# Patient Record
Sex: Female | Born: 1965 | Race: Black or African American | Hispanic: No | Marital: Single
Health system: Southern US, Community
[De-identification: ages and names within clinical notes are randomized; demographics above are authoritative.]

---

## 2004-01-10 ENCOUNTER — Other Ambulatory Visit: Admission: RE | Admit: 2004-01-10 | Discharge: 2004-01-10 | Payer: Self-pay | Admitting: Obstetrics & Gynecology

## 2004-01-30 ENCOUNTER — Encounter (INDEPENDENT_AMBULATORY_CARE_PROVIDER_SITE_OTHER): Payer: Self-pay | Admitting: Specialist

## 2004-01-30 ENCOUNTER — Ambulatory Visit (HOSPITAL_COMMUNITY): Admission: RE | Admit: 2004-01-30 | Discharge: 2004-01-30 | Payer: Self-pay | Admitting: Obstetrics & Gynecology

## 2008-04-26 ENCOUNTER — Ambulatory Visit (HOSPITAL_COMMUNITY): Admission: RE | Admit: 2008-04-26 | Discharge: 2008-04-26 | Payer: Self-pay | Admitting: Obstetrics & Gynecology

## 2008-04-27 ENCOUNTER — Encounter (INDEPENDENT_AMBULATORY_CARE_PROVIDER_SITE_OTHER): Payer: Self-pay | Admitting: Obstetrics & Gynecology

## 2008-04-27 ENCOUNTER — Ambulatory Visit (HOSPITAL_COMMUNITY): Admission: RE | Admit: 2008-04-27 | Discharge: 2008-04-27 | Payer: Self-pay | Admitting: Obstetrics & Gynecology

## 2008-10-23 ENCOUNTER — Ambulatory Visit (HOSPITAL_COMMUNITY): Admission: RE | Admit: 2008-10-23 | Discharge: 2008-10-23 | Payer: Self-pay

## 2010-04-06 IMAGING — RF DG HYSTEROGRAM
7 series · 7 of 7 positions shown · IV contrast (omnipaque)
Comparison: none

CLINICAL DATA: Infertility.  The patient reports a history of two
miscarriages.

HYSTEROSALPINGOGRAM
TECHNIQUE: Following cleansing of the cervix and vagina with
Betadine solution, a hysterosalpingogram was performed using a 5-
French hysterosalpingogram catheter and Omnipaque 300 contrast. The
patient tolerated the examination without difficulty.
Fluoroscopy time: 1.5 minutes.

[Series 1: run · 1 of 1 slices shown (1 of 7)]
[im 1/1]
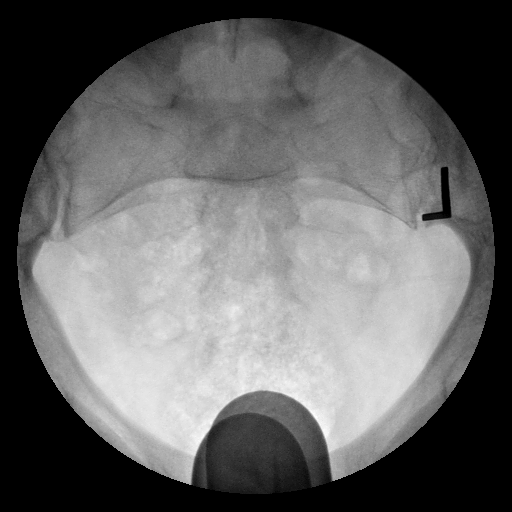

[Series 2: run · 1 of 1 slices shown (2 of 7)]
[im 1/1]
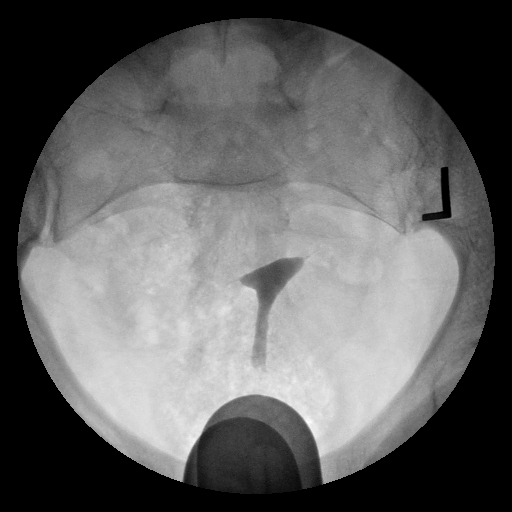

[Series 3: run · 1 of 1 slices shown (3 of 7)]
[im 1/1]
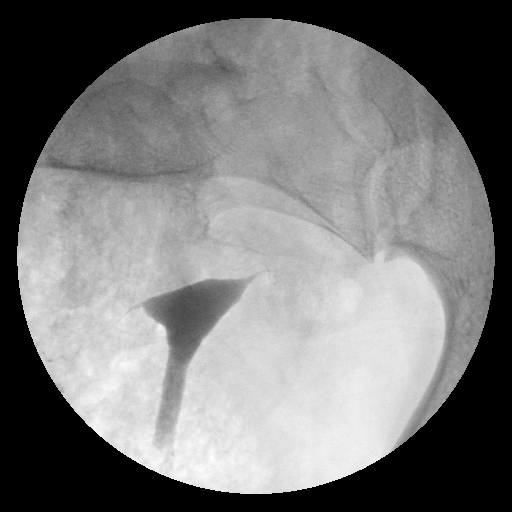

[Series 4: run · 1 of 1 slices shown (4 of 7)]
[im 1/1]
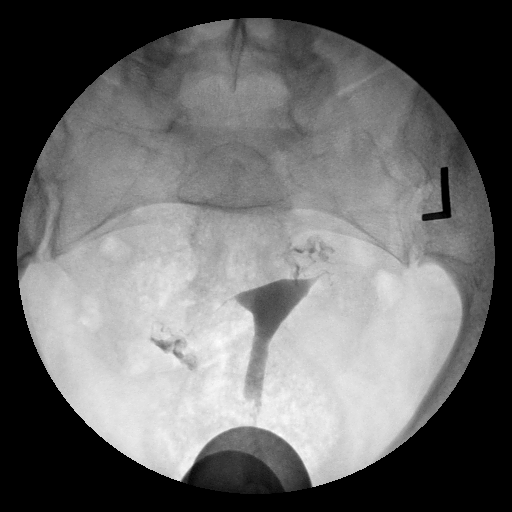

[Series 5: run · 1 of 1 slices shown (5 of 7)]
[im 1/1]
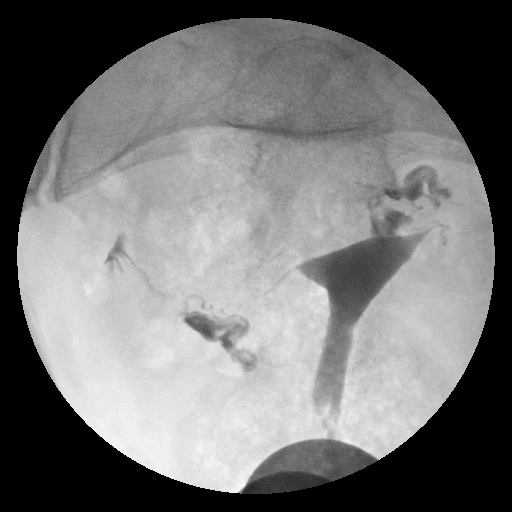

[Series 6: run · 1 of 1 slices shown (6 of 7)]
[im 1/1]
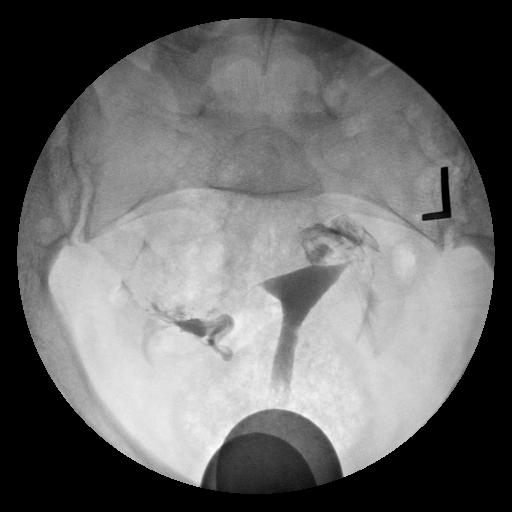

[Series 7: run · 1 of 1 slices shown (7 of 7)]
[im 1/1]
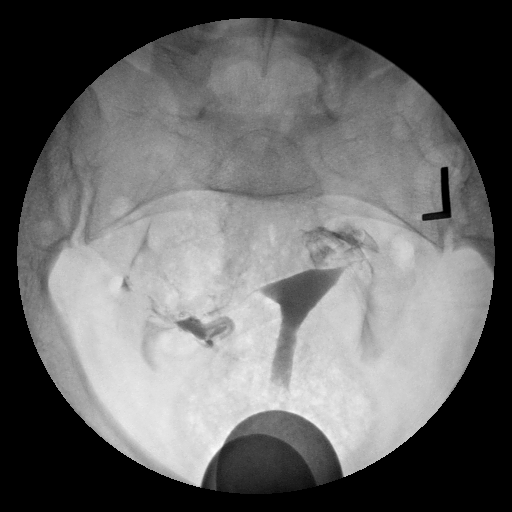

[7 of 7 positions shown; findings below may reference images not displayed]

FINDINGS: The endometrial cavity is normal in shape and contour.
There are no filling defects within the endometrial cavity.  Both
fallopian tubes have normal appearances and opacify with contrast.
Free spill of contrast is seen from both fallopian tubes.
IMPRESSION: Normal hysterosalpingogram.

## 2010-04-24 LAB — CBC
HCT: 32.1 % — ABNORMAL LOW (ref 36.0–46.0)
MCHC: 33.6 g/dL (ref 30.0–36.0)
MCV: 88 fL (ref 78.0–100.0)
WBC: 8.9 10*3/uL (ref 4.0–10.5)

## 2010-05-27 ENCOUNTER — Emergency Department (HOSPITAL_COMMUNITY)
Admission: EM | Admit: 2010-05-27 | Discharge: 2010-05-27 | Disposition: A | Discharge status: Home / Self Care | Payer: Self-pay | Attending: Emergency Medicine | Admitting: Emergency Medicine

## 2010-05-27 DIAGNOSIS — M545 Low back pain, unspecified: Secondary | ICD-10-CM | POA: Insufficient documentation

## 2010-05-27 DIAGNOSIS — I10 Essential (primary) hypertension: Secondary | ICD-10-CM | POA: Insufficient documentation

## 2010-05-27 DIAGNOSIS — N390 Urinary tract infection, site not specified: Secondary | ICD-10-CM | POA: Insufficient documentation

## 2010-05-27 DIAGNOSIS — R300 Dysuria: Secondary | ICD-10-CM | POA: Insufficient documentation

## 2010-05-27 LAB — URINALYSIS, ROUTINE W REFLEX MICROSCOPIC
Glucose, UA: NEGATIVE mg/dL
Ketones, ur: NEGATIVE mg/dL
pH: 7 (ref 5.0–8.0)

## 2010-05-27 LAB — URINE MICROSCOPIC-ADD ON

## 2010-05-27 LAB — POCT PREGNANCY, URINE: Preg Test, Ur: NEGATIVE

## 2010-05-28 NOTE — Op Note (Signed)
[name], [name] NO.:  [id number]   MEDICAL RECORD NO.:  [id number]          PATIENT TYPE:  AMB   LOCATION:  SDC                           FACILITY:  [facility]   PHYSICIAN:  Ilda Mori, M.D.   DATE OF BIRTH:  1965-10-24   DATE OF PROCEDURE:  04/27/2008  DATE OF DISCHARGE:                               OPERATIVE REPORT   PREOPERATIVE DIAGNOSIS:  Missed abortion.   POSTOPERATIVE DIAGNOSIS:  Missed abortion.   PROCEDURE:  Dilatation and evacuation.   SURGEON:  Ilda Mori, MD   ANESTHESIA:  MAC with her paracervical block.   FINDINGS:  Retroverted 9-10-week size uterus.  Products of conception  consistent with a 9-week missed abortion.   SPECIMEN:  Products of conception which was sent to Pathology.   ESTIMATED BLOOD LOSS:  20 mL.   COMPLICATIONS:  None.   INDICATIONS:  This is a 45 year old gravida 2, para 0-0-1-0 female who  is [redacted] weeks gestation, who presented to the office on April 14 for  routine examination, was found at that time to have a fetal demise on  ultrasound.  This finding was confirmed at [facility].  The diagnosis was explained to the patient who elected to proceed with  dilation and evacuation.   PROCEDURE:  The patient was taken to the operative room and IV sedation  was administered.  She was placed in dorsal lithotomy position.  The  vagina and peritoneum were prepped and draped in sterile fashion.  Evaluation under anesthesia revealed retroverted 9-10-week size uterus.  A 10 mL of 2% lidocaine was placed in the paracervical tissue.  The  internal os was dilated with Pratt dilators to a 27-French.  A #9  suction curette was introduced into endometrial cavity and products of  conception were evacuated.  The procedure was then terminated.  The  patient left the operating room in good condition.      Ilda Mori, M.D.  Electronically Signed     RK/MEDQ  D:  04/27/2008  T:  04/28/2008  Job:  [id number]

## 2010-05-31 ENCOUNTER — Emergency Department (HOSPITAL_COMMUNITY)
Admission: EM | Admit: 2010-05-31 | Discharge: 2010-05-31 | Disposition: A | Discharge status: Home / Self Care | Payer: Medicaid Other | Attending: Emergency Medicine | Admitting: Emergency Medicine

## 2010-05-31 DIAGNOSIS — N898 Other specified noninflammatory disorders of vagina: Secondary | ICD-10-CM | POA: Insufficient documentation

## 2010-05-31 DIAGNOSIS — A64 Unspecified sexually transmitted disease: Secondary | ICD-10-CM | POA: Insufficient documentation

## 2010-05-31 DIAGNOSIS — I10 Essential (primary) hypertension: Secondary | ICD-10-CM | POA: Insufficient documentation

## 2010-05-31 DIAGNOSIS — N949 Unspecified condition associated with female genital organs and menstrual cycle: Secondary | ICD-10-CM | POA: Insufficient documentation

## 2010-05-31 DIAGNOSIS — A5901 Trichomonal vulvovaginitis: Secondary | ICD-10-CM | POA: Insufficient documentation

## 2010-05-31 DIAGNOSIS — N390 Urinary tract infection, site not specified: Secondary | ICD-10-CM | POA: Insufficient documentation

## 2010-05-31 DIAGNOSIS — R3915 Urgency of urination: Secondary | ICD-10-CM | POA: Insufficient documentation

## 2010-05-31 LAB — URINE MICROSCOPIC-ADD ON

## 2010-05-31 LAB — URINALYSIS, ROUTINE W REFLEX MICROSCOPIC
Bilirubin Urine: NEGATIVE
Glucose, UA: NEGATIVE mg/dL
Nitrite: NEGATIVE
Specific Gravity, Urine: 1.016 (ref 1.005–1.030)
Specific Gravity, Urine: 1.022 (ref 1.005–1.030)
pH: 5.5 (ref 5.0–8.0)

## 2010-05-31 LAB — WET PREP, GENITAL: Yeast Wet Prep HPF POC: NONE SEEN

## 2010-05-31 NOTE — Op Note (Signed)
NAMETRUTH, WOLAVER NO.:  [id number]   MEDICAL RECORD NO.:  [id number]          PATIENT TYPE:  AMB   LOCATION:  SDC                           FACILITY:  [facility]   PHYSICIAN:  Ilda Mori, M.D.   DATE OF BIRTH:  1965/01/31   DATE OF PROCEDURE:  01/30/2004  DATE OF DISCHARGE:                                 OPERATIVE REPORT   PREOPERATIVE DIAGNOSIS:  Missed abortion.   POSTOPERATIVE DIAGNOSIS:  Missed abortion.   PROCEDURE:  Dilatation and evacuation.   SURGEON:  Ilda Mori, M.D.   ANESTHESIA:  Paracervical block with IV sedation.   ESTIMATED BLOOD LOSS:  20 mL.   FINDINGS:  Products of conception consistent with an eight to 10-week  pregnancy.   INDICATIONS:  This is a 45 year old primigravid female who presented to the  office in the middle of December with a pregnancy.  She returned for a  follow-up visit at 12 weeks' gestation and no fetal heart tones could be  heard.  An ultrasound was performed, which showed a missed AB with an eight-  week fetal pole and no fetal heart tones.  The findings were discussed with  the patient, who elected to proceed with immediate dilatation and  evacuation.   PROCEDURE:  The patient was brought to the operating room and IV sedation  was administered.  She was placed in the dorsal lithotomy position and the  vagina and vulva were prepped and draped in a sterile fashion.  The cervix  was grasped with a single-tooth tenaculum and 20 mL of 1% lidocaine was  infiltrated in the paracervical tissues.  The internal os was then dilated  with Shawnie Pons dilators to a #25 [place].  A #8 suction curette was then  introduced into the endometrial cavity and the products of conception were  evacuated.  The procedure was then terminated and the patient left the  operating room in good condition.      RK/MEDQ  D:  01/30/2004  T:  01/30/2004  Job:  [id number]

## 2010-06-01 LAB — GC/CHLAMYDIA PROBE AMP, GENITAL
Chlamydia, DNA Probe: NEGATIVE
GC Probe Amp, Genital: NEGATIVE

## 2010-06-02 LAB — URINE CULTURE: Colony Count: NO GROWTH

## 2021-07-26 ENCOUNTER — Encounter: Payer: Self-pay | Admitting: Emergency Medicine

## 2021-07-26 ENCOUNTER — Emergency Department
Admission: EM | Admit: 2021-07-26 | Discharge: 2021-07-26 | Disposition: A | Discharge status: Home / Self Care | Payer: Self-pay | Attending: Emergency Medicine | Admitting: Emergency Medicine

## 2021-07-26 DIAGNOSIS — I10 Essential (primary) hypertension: Secondary | ICD-10-CM | POA: Insufficient documentation

## 2021-07-26 NOTE — ED Provider Notes (Signed)
[facility] Provider Note    Event Date/Time   First MD Initiated Contact with Patient 07/26/21 2013     (approximate)   History   Dental Pain and Hypertension   HPI  Rachel Morton is a 56 y.o. female presents to the emergency department for treatment and evaluation of hypertension.  She had gone to the dentist to have 2 teeth pulled but they checked her blood pressure and told her they could not do her procedure due to her hypertension and asked her to come to the emergency department.  She is currently on amoxicillin for her dental pain/infection.  No past medical history on file.   Physical Exam   Triage Vital Signs: ED Triage Vitals  Enc Vitals Group     BP 07/26/21 1912 (!) 221/88     Pulse Rate 07/26/21 1912 94     Resp 07/26/21 1912 18     Temp 07/26/21 1912 98.7 F (37.1 C)     Temp Source 07/26/21 1912 Oral     SpO2 07/26/21 1912 100 %     Weight 07/26/21 1912 200 lb (90.7 kg)     Height 07/26/21 1912 5\' 7"  (1.702 m)     Head Circumference --      Peak Flow --      Pain Score 07/26/21 1916 8     Pain Loc --      Pain Edu? --      Excl. in GC? --     Most recent vital signs: Vitals:   07/26/21 2020 07/26/21 2135  BP: (!) 195/95 (!) 190/101  Pulse: 92 91  Resp: 18 17  Temp:    SpO2: 95% 96%    General: Awake, no distress.  CV:  Good peripheral perfusion.  Resp:  Normal effort.  Abd:  No distention.  Other:     ED Results / Procedures / Treatments   Labs (all labs ordered are listed, but only abnormal results are displayed) Labs Reviewed - No data to display   EKG  Not indicated   RADIOLOGY  Not indicated  I have independently reviewed and interpreted imaging as well as reviewed report from radiology.  PROCEDURES:  Critical Care performed: No  Procedures   MEDICATIONS ORDERED IN ED:  Medications - No data to display   IMPRESSION / MDM / ASSESSMENT AND PLAN / ED COURSE   I reviewed the triage  vital signs and the nursing notes.  Differential diagnosis includes, but is not limited to: Primary hypertension, situational hypertension  Patient's presentation is most consistent with acute, uncomplicated illness.  56 year old female presenting to the emergency department for evaluation of hypertension.  She has never been diagnosed with hypertension in the past and had not had any symptoms.  Today she went to her dentist to have a dental procedure and they turned her away after her blood pressure was elevated.  She is having no chest pain, blurred vision, shortness of breath or other symptoms of concern.  She states she does have a headache but she is stressed because she has had a very long day after working and going to the dentist and being sent to the emergency department.  We discussed keeping a blood pressure log and scheduling an appointment with primary care.  She is comfortable with this plan.  She was also given strict ER return precautions     FINAL CLINICAL IMPRESSION(S) / ED DIAGNOSES   Final diagnoses:  Hypertension, unspecified type  Rx / DC Orders   ED Discharge Orders     None        Note:  This document was prepared using Dragon voice recognition software and may include unintentional dictation errors.   Chinita Pester, FNP 07/27/21 1712    Georga Hacking, MD 07/29/21 [phone]

## 2021-07-26 NOTE — ED Triage Notes (Signed)
Pt went to the dentist to have 2 teeth pulled. Started taking amoxicillin Wednesday. Pt was too hypertensive with the dentist so they told her to come here.

## 2021-07-26 NOTE — Discharge Instructions (Signed)
Please monitor your blood pressure daily and keep a log.  If you have any concerning symptoms such as chest pain, shortness of breath, persistent headache, or blurred vision please return to the emergency department.  Please establish a primary care provider as soon as possible and take your blood pressure log to your first appointment.  Continue taking your amoxicillin as prescribed and reschedule your dentist appointment.
